# Patient Record
Sex: Male | Born: 2000 | Race: White | Hispanic: Yes | Marital: Single | Smoking: Never smoker
Health system: Southern US, Community
[De-identification: ages and names within clinical notes are randomized; demographics above are authoritative.]

## PROBLEM LIST (undated history)

## (undated) DIAGNOSIS — Z789 Other specified health status: Secondary | ICD-10-CM

---
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
## 2023-03-03 ENCOUNTER — Telehealth: Payer: Self-pay | Admitting: Family Medicine

## 2023-03-04 ENCOUNTER — Ambulatory Visit: Payer: Medicaid Other | Admitting: Family Medicine

## 2023-03-04 ENCOUNTER — Encounter: Payer: Self-pay | Admitting: Family Medicine

## 2023-03-04 VITALS — BP 129/84 | HR 89 | Ht 68.0 in | Wt 310.0 lb

## 2023-03-04 DIAGNOSIS — Z7689 Persons encountering health services in other specified circumstances: Secondary | ICD-10-CM

## 2023-03-04 DIAGNOSIS — K5909 Other constipation: Secondary | ICD-10-CM

## 2023-03-04 DIAGNOSIS — N5089 Other specified disorders of the male genital organs: Secondary | ICD-10-CM | POA: Diagnosis not present

## 2023-03-04 DIAGNOSIS — Z8669 Personal history of other diseases of the nervous system and sense organs: Secondary | ICD-10-CM

## 2023-03-04 DIAGNOSIS — K648 Other hemorrhoids: Secondary | ICD-10-CM | POA: Diagnosis not present

## 2023-03-04 DIAGNOSIS — Z5320 Procedure and treatment not carried out because of patient's decision for unspecified reasons: Secondary | ICD-10-CM

## 2023-03-04 DIAGNOSIS — L83 Acanthosis nigricans: Secondary | ICD-10-CM

## 2023-03-04 MED ORDER — POLYETHYLENE GLYCOL 3350 17 GM/SCOOP PO POWD: 17.0000 g | ORAL | 1 refills | Status: AC | PRN

## 2023-03-04 NOTE — Assessment & Plan Note (Signed)
Unknown; recommend A1c given obesity Body mass index is 47.14 kg/m.

## 2023-03-04 NOTE — Assessment & Plan Note (Signed)
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 Graduated HS Things to do to keep yourself healthy  - Exercise at least 30-45 minutes a day, 3-4 days a week.  - Eat a low-fat diet with lots of fruits and vegetables, up to 7-9 servings per day.  - Seatbelts can save your life. Wear them always.  - Smoke detectors on every level of your home, check batteries every year.  - Eye Doctor - have an eye exam every 1-2 years  - Safe sex - if you may be exposed to STDs, use a condom.  - Alcohol -  If you drink, do it moderately, less than 2 drinks per day.  - Health Care Power of Attorney. Choose someone to speak for you if you are not able.  - Depression is common in our stressful world.If you're feeling down or losing interest in things you normally enjoy, please come in for a visit.  - Violence - If anyone is threatening or hurting you, please call immediately.

## 2023-03-04 NOTE — Progress Notes (Signed)
New patient visit   Patient: Kelly Mccall   DOB: October 31, 2000   22 y.o. Male  MRN: [id number] Visit Date: 03/04/2023  Today's healthcare provider: Jacky Kindle, FNP  Patient presents for new patient visit to establish care.  Introduced to Publishing rights manager role and practice setting.  All questions answered.  Discussed provider/patient relationship and expectations.  Presents with father  Chief Complaint  Patient presents with   Rectal Bleeding    Pt stated--constipated , rectal bleeding, painful when using the bathroom. More than 1 week..   Subjective    Kelly Mccall is a 22 y.o. male who presents today as a new patient to establish care.  HPI HPI     Rectal Bleeding    Additional comments: Pt stated--constipated , rectal bleeding, painful when using the bathroom. More than 1 week..      Last edited by Shelly Bombard, CMA on 03/04/2023  1:55 PM.      History reviewed. No pertinent past medical history. History reviewed. No pertinent surgical history. Family Status  Relation Name Status   Mother  Alive   Father  Alive   Brother  Alive   Family History  Problem Relation Age of Onset   Healthy Mother    Colon cancer Father    Healthy Brother    Social History   Socioeconomic History   Marital status: Single    Spouse name: Not on file   Number of children: Not on file   Years of education: Not on file   Highest education level: Not on file  Occupational History   Not on file  Tobacco Use   Smoking status: Never   Smokeless tobacco: Never  Substance and Sexual Activity   Alcohol use: No   Drug use: Never   Sexual activity: Not on file  Other Topics Concern   Not on file  Social History Narrative   Not on file   Social Determinants of Health   Financial Resource Strain: Not on file  Food Insecurity: Not on file  Transportation Needs: Not on file  Physical Activity: Not on file  Stress: Not on file  Social Connections: Not on file    Outpatient Medications Prior to Visit  Medication Sig   Docusate Calcium (STOOL SOFTENER PO) Take by mouth.   [DISCONTINUED] ibuprofen (ADVIL,MOTRIN) 600 MG tablet Take 1 tablet (600 mg total) every 8 (eight) hours as needed by mouth. (Patient not taking: Reported on 03/04/2023)   [DISCONTINUED] ibuprofen (ADVIL,MOTRIN) 600 MG tablet Take 1 tablet (600 mg total) by mouth every 8 (eight) hours as needed. (Patient not taking: Reported on 03/04/2023)   No facility-administered medications prior to visit.   No Known Allergies   There is no immunization history on file for this patient.  Health Maintenance  Topic Date Due   COVID-19 Vaccine (1) Never done   HPV VACCINES (1 - Male 2-dose series) Never done   HIV Screening  Never done   Hepatitis C Screening  Never done   DTaP/Tdap/Td (1 - Tdap) Never done   INFLUENZA VACCINE  05/22/2023    Patient Care Team: Jacky Kindle, FNP as PCP - General (Family Medicine)  Review of Systems   Objective    BP 129/84 (BP Location: Right Arm, Patient Position: Sitting, Cuff Size: Normal)   Pulse 89   Ht 5\' 8"  (1.727 m)   Wt (!) 310 lb (140.6 kg)   SpO2 98%   BMI 47.14 kg/m  Physical Exam Vitals and nursing note reviewed.  Constitutional:      General: He is awake. He is not in acute distress.    Appearance: Normal appearance. He is well-developed and well-groomed. He is obese. He is not ill-appearing, toxic-appearing or diaphoretic.  HENT:     Head: Normocephalic and atraumatic.     Jaw: There is normal jaw occlusion. No trismus, tenderness, swelling or pain on movement.     Salivary Glands: Right salivary gland is not diffusely enlarged or tender. Left salivary gland is not diffusely enlarged or tender.      Right Ear: Hearing, tympanic membrane, ear canal and external ear normal. There is no impacted cerumen.     Left Ear: Hearing, tympanic membrane, ear canal and external ear normal. There is no impacted cerumen.     Nose: Nose  normal. No congestion or rhinorrhea.     Right Turbinates: Not enlarged, swollen or pale.     Left Turbinates: Not enlarged, swollen or pale.     Right Sinus: No maxillary sinus tenderness or frontal sinus tenderness.     Left Sinus: No maxillary sinus tenderness or frontal sinus tenderness.     Mouth/Throat:     Lips: Pink.     Mouth: Mucous membranes are moist. No injury, lacerations, oral lesions or angioedema.     Pharynx: Oropharynx is clear. Uvula midline. No pharyngeal swelling, oropharyngeal exudate or posterior oropharyngeal erythema.     Tonsils: No tonsillar exudate or tonsillar abscesses.  Eyes:     General: Lids are normal. Vision grossly intact. Gaze aligned appropriately.        Right eye: No discharge.        Left eye: No discharge.     Extraocular Movements: Extraocular movements intact.     Conjunctiva/sclera: Conjunctivae normal.     Pupils: Pupils are equal, round, and reactive to light.  Neck:     Thyroid: No thyroid mass, thyromegaly or thyroid tenderness.     Vascular: No carotid bruit.     Trachea: Trachea normal. No tracheal tenderness.  Cardiovascular:     Rate and Rhythm: Normal rate and regular rhythm.     Pulses: Normal pulses.          Carotid pulses are 2+ on the right side and 2+ on the left side.      Radial pulses are 2+ on the right side and 2+ on the left side.       Femoral pulses are 2+ on the right side and 2+ on the left side.      Popliteal pulses are 2+ on the right side and 2+ on the left side.       Dorsalis pedis pulses are 2+ on the right side and 2+ on the left side.       Posterior tibial pulses are 2+ on the right side and 2+ on the left side.     Heart sounds: Normal heart sounds, S1 normal and S2 normal. No murmur heard.    No friction rub. No gallop.  Pulmonary:     Effort: Pulmonary effort is normal. No respiratory distress.     Breath sounds: Normal breath sounds and air entry. No stridor. No wheezing, rhonchi or rales.  Chest:      Chest wall: No tenderness.  Abdominal:     General: Abdomen is flat. Bowel sounds are normal. There is no distension.     Palpations: Abdomen is soft. There is no mass.  Tenderness: There is no abdominal tenderness. There is no guarding or rebound.     Hernia: No hernia is present.  Genitourinary:    Pubic Area: No rash or pubic lice.      Penis: Normal.      Testes:        Right: Tenderness present.        Left: Mass, tenderness, swelling and varicocele present.     Epididymis:     Right: Normal.     Left: Inflamed and enlarged. Tenderness present.     Comments: Refer for [place]; denies concern for STI Musculoskeletal:        General: No swelling, tenderness, deformity or signs of injury. Normal range of motion.     Cervical back: Normal range of motion and neck supple. No rigidity or tenderness.     Right lower leg: No edema.     Left lower leg: No edema.  Lymphadenopathy:     Cervical: No cervical adenopathy.     Right cervical: No superficial, deep or posterior cervical adenopathy.    Left cervical: No superficial, deep or posterior cervical adenopathy.  Skin:    General: Skin is warm and dry.     Capillary Refill: Capillary refill takes less than 2 seconds.     Coloration: Skin is not jaundiced or pale.     Findings: Lesion and rash present. No bruising or erythema. Rash is scaling.          Comments: Healing psoriasis plaques to neck and posterior ears; L>R  Neurological:     General: No focal deficit present.     Mental Status: He is alert and oriented to person, place, and time. Mental status is at baseline.     GCS: GCS eye subscore is 4. GCS verbal subscore is 5. GCS motor subscore is 6.     Sensory: Sensation is intact. No sensory deficit.     Motor: Motor function is intact. No weakness.     Coordination: Coordination is intact.     Gait: Gait is intact.  Psychiatric:        Attention and Perception: Attention and perception normal.        Mood and Affect:  Mood and affect normal.        Speech: Speech normal.        Behavior: Behavior normal. Behavior is cooperative.        Thought Content: Thought content normal.        Cognition and Memory: Cognition normal.        Judgment: Judgment normal.    Depression Screen    03/04/2023    2:04 PM  PHQ 2/9 Scores  PHQ - 2 Score 0  PHQ- 9 Score 0   No results found for any visits on 03/04/23.  Assessment & Plan      Problem List Items Addressed This Visit       Cardiovascular and Mediastinum   Internal hemorrhoid, bleeding - Primary    Negative guaiac; continue to encourage healthy diet, normal weight, exercise and water intake Discussed use of stools and daily BM without straining to assist Deferred rectal exam given no recent concerns for bleeding and previously normal at [facility]      Relevant Orders   Comprehensive Metabolic Panel (CMET)   Ambulatory referral to Gastroenterology     Digestive   Chronic constipation    Notes sitting on the toilet for extended periods of time; unknown cause Father with stage  4 colon cancer Referral to gi; continue to monitor s/s- goal 1 daily BM Working on fiber, whole grains, exercise, water control       Relevant Medications   Docusate Calcium (STOOL SOFTENER PO)   polyethylene glycol powder (GLYCOLAX/MIRALAX) 17 GM/SCOOP powder   Other Relevant Orders   CBC with Differential/Platelet   Comprehensive Metabolic Panel (CMET)   Hemoglobin A1c   Lipid panel   Ambulatory referral to Gastroenterology     Musculoskeletal and Integument   Acanthosis nigricans    Unknown; recommend A1c given obesity Body mass index is 47.14 kg/m.       Relevant Orders   Hemoglobin A1c     Other   Encounter to establish care    Self pay- not employed; not a Consulting civil engineer. Graduated HS Things to do to keep yourself healthy  - Exercise at least 30-45 minutes a day, 3-4 days a week.  - Eat a low-fat diet with lots of fruits and vegetables, up to 7-9 servings per  day.  - Seatbelts can save your life. Wear them always.  - Smoke detectors on every level of your home, check batteries every year.  - Eye Doctor - have an eye exam every 1-2 years  - Safe sex - if you may be exposed to STDs, use a condom.  - Alcohol -  If you drink, do it moderately, less than 2 drinks per day.  - Health Care Power of Attorney. Choose someone to speak for you if you are not able.  - Depression is common in our stressful world.If you're feeling down or losing interest in things you normally enjoy, please come in for a visit.  - Violence - If anyone is threatening or hurting you, please call immediately.       History of Bell's palsy    Slight deficit noted in full smile on L side; defer Mg at this time d/t cost       HIV screening declined   Morbid obesity (HCC)    Chronic, unknown change Body mass index is 47.14 kg/m. Discussed importance of healthy weight management Discussed diet and exercise       Relevant Orders   CBC with Differential/Platelet   Comprehensive Metabolic Panel (CMET)   Hemoglobin A1c   Lipid panel   Screening for hepatitis C declined   Testicular mass    L with tenderness; referral placed for [place] Patient denies STI risk Notes slight weight change in setting of chronic constipation and use of stool softeners; unable to quantify       Relevant Orders   US Scrotum   Return if symptoms worsen or fail to improve.    Leilani Merl, FNP, have reviewed all documentation for this visit. The documentation on 03/04/23 for the exam, diagnosis, procedures, and orders are all accurate and complete.  Jacky Kindle, FNP  [facility] [phone] (phone) [phone] (fax)  [facility]

## 2023-03-04 NOTE — Assessment & Plan Note (Signed)
Negative guaiac; continue to encourage healthy diet, normal weight, exercise and water intake Discussed use of stools and daily BM without straining to assist Deferred rectal exam given no recent concerns for bleeding and previously normal at [facility]

## 2023-03-04 NOTE — Assessment & Plan Note (Signed)
Chronic, unknown change Body mass index is 47.14 kg/m. Discussed importance of healthy weight management Discussed diet and exercise

## 2023-03-04 NOTE — Assessment & Plan Note (Signed)
Slight deficit noted in full smile on L side; defer Mg at this time d/t cost

## 2023-03-04 NOTE — Assessment & Plan Note (Signed)
Notes sitting on the toilet for extended periods of time; unknown cause Father with stage 4 colon cancer Referral to gi; continue to monitor s/s- goal 1 daily BM Working on fiber, whole grains, exercise, water control

## 2023-03-04 NOTE — Assessment & Plan Note (Signed)
L with tenderness; referral placed for [place] Patient denies STI risk Notes slight weight change in setting of chronic constipation and use of stool softeners; unable to quantify

## 2023-03-05 ENCOUNTER — Other Ambulatory Visit: Payer: Self-pay | Admitting: Family Medicine

## 2023-03-05 ENCOUNTER — Other Ambulatory Visit: Payer: Self-pay

## 2023-03-05 ENCOUNTER — Ambulatory Visit
Admission: RE | Admit: 2023-03-05 | Discharge: 2023-03-05 | Disposition: A | Discharge status: Home / Self Care | Payer: Medicaid Other | Source: Ambulatory Visit | Attending: Family Medicine | Admitting: Family Medicine

## 2023-03-05 DIAGNOSIS — K5909 Other constipation: Secondary | ICD-10-CM

## 2023-03-05 DIAGNOSIS — N5089 Other specified disorders of the male genital organs: Secondary | ICD-10-CM

## 2023-03-05 DIAGNOSIS — Z5320 Procedure and treatment not carried out because of patient's decision for unspecified reasons: Secondary | ICD-10-CM

## 2023-03-05 DIAGNOSIS — Z8669 Personal history of other diseases of the nervous system and sense organs: Secondary | ICD-10-CM

## 2023-03-05 DIAGNOSIS — Z7689 Persons encountering health services in other specified circumstances: Secondary | ICD-10-CM

## 2023-03-05 DIAGNOSIS — L83 Acanthosis nigricans: Secondary | ICD-10-CM

## 2023-03-05 DIAGNOSIS — K648 Other hemorrhoids: Secondary | ICD-10-CM

## 2023-03-05 LAB — HEMOGLOBIN A1C
Est. average glucose Bld gHb Est-mCnc: 120 mg/dL
Hgb A1c MFr Bld: 5.8 % — ABNORMAL HIGH (ref 4.8–5.6)

## 2023-03-05 LAB — LIPID PANEL
Chol/HDL Ratio: 5.5 ratio — ABNORMAL HIGH (ref 0.0–5.0)
Cholesterol, Total: 170 mg/dL (ref 100–199)
HDL: 31 mg/dL — ABNORMAL LOW (ref >39)
LDL Chol Calc (NIH): 113 mg/dL — ABNORMAL HIGH (ref 0–99)
Triglycerides: 144 mg/dL (ref 0–149)
VLDL Cholesterol Cal: 26 mg/dL (ref 5–40)

## 2023-03-05 LAB — CBC WITH DIFFERENTIAL/PLATELET
Basophils Absolute: 0 10*3/uL (ref 0.0–0.2)
Basos: 0 %
EOS (ABSOLUTE): 0.3 10*3/uL (ref 0.0–0.4)
Eos: 3 %
Hematocrit: 43.9 % (ref 37.5–51.0)
Hemoglobin: 14.2 g/dL (ref 13.0–17.7)
Immature Grans (Abs): 0 10*3/uL (ref 0.0–0.1)
Immature Granulocytes: 0 %
Lymphocytes Absolute: 4.9 10*3/uL — ABNORMAL HIGH (ref 0.7–3.1)
Lymphs: 45 %
MCH: 26.7 pg (ref 26.6–33.0)
MCHC: 32.3 g/dL (ref 31.5–35.7)
MCV: 83 fL (ref 79–97)
Monocytes Absolute: 0.6 10*3/uL (ref 0.1–0.9)
Monocytes: 6 %
Neutrophils Absolute: 5 10*3/uL (ref 1.4–7.0)
Neutrophils: 46 %
Platelets: 411 10*3/uL (ref 150–450)
RBC: 5.31 x10E6/uL (ref 4.14–5.80)
RDW: 13.7 % (ref 11.6–15.4)
WBC: 10.9 10*3/uL — ABNORMAL HIGH (ref 3.4–10.8)

## 2023-03-05 LAB — COMPREHENSIVE METABOLIC PANEL
ALT: 137 IU/L — ABNORMAL HIGH (ref 0–44)
AST: 41 IU/L — ABNORMAL HIGH (ref 0–40)
Albumin/Globulin Ratio: 1.6 (ref 1.2–2.2)
Albumin: 4.4 g/dL (ref 4.3–5.2)
Alkaline Phosphatase: 83 IU/L (ref 51–125)
BUN/Creatinine Ratio: 11 (ref 9–20)
BUN: 9 mg/dL (ref 6–20)
Bilirubin Total: 0.4 mg/dL (ref 0.0–1.2)
CO2: 26 mmol/L (ref 20–29)
Calcium: 9.5 mg/dL (ref 8.7–10.2)
Chloride: 102 mmol/L (ref 96–106)
Creatinine, Ser: 0.81 mg/dL (ref 0.76–1.27)
Globulin, Total: 2.8 g/dL (ref 1.5–4.5)
Glucose: 93 mg/dL (ref 70–99)
Potassium: 4.5 mmol/L (ref 3.5–5.2)
Sodium: 140 mmol/L (ref 134–144)
Total Protein: 7.2 g/dL (ref 6.0–8.5)
eGFR: 129 mL/min/{1.73_m2} (ref >59)

## 2023-03-05 NOTE — Progress Notes (Signed)
Bilateral epididymal head cysts measuring up to 2.0 cm on the left and 0.8 cm on the right.  Continue to monitor; benign in nature. If further inflammation remains we can get you in with urology for cyst draining.

## 2023-03-05 NOTE — Progress Notes (Signed)
-   Borderline infectious/inflammatory markers- WBC/lymphocytes. No anemia noted. - Blood chemistry shows elevated liver enzymes; which can come from fat in diet, recent alcohol use or excessive use of NSAIDs. Recommend avoid NSAIDs in setting of bleeding concerns. OK to use tylenol to assist if any minor pains.  - A1c shows pre-diabetes. Continue to recommend balanced, lower carb meals. Smaller meal size, adding snacks. Choosing water as drink of choice and increasing purposeful exercise. - Cholesterol shows high bad/LDL cholesterol and low good/HDL cholesterol. I continue to recommend diet low in saturated fat and regular exercise - 30 min at least 5 times per week

## 2023-03-06 ENCOUNTER — Ambulatory Visit (INDEPENDENT_AMBULATORY_CARE_PROVIDER_SITE_OTHER): Payer: Medicaid Other | Admitting: Gastroenterology

## 2023-03-06 ENCOUNTER — Other Ambulatory Visit: Payer: Self-pay

## 2023-03-06 ENCOUNTER — Encounter: Payer: Self-pay | Admitting: Gastroenterology

## 2023-03-06 VITALS — BP 123/89 | HR 76 | Temp 97.8°F | Ht 68.0 in | Wt 310.2 lb

## 2023-03-06 DIAGNOSIS — E8881 Metabolic syndrome: Secondary | ICD-10-CM | POA: Diagnosis not present

## 2023-03-06 DIAGNOSIS — K625 Hemorrhage of anus and rectum: Secondary | ICD-10-CM

## 2023-03-06 DIAGNOSIS — Z800 Family history of malignant neoplasm of digestive organs: Secondary | ICD-10-CM

## 2023-03-06 DIAGNOSIS — R7989 Other specified abnormal findings of blood chemistry: Secondary | ICD-10-CM

## 2023-03-06 DIAGNOSIS — K5909 Other constipation: Secondary | ICD-10-CM | POA: Diagnosis not present

## 2023-03-06 NOTE — Progress Notes (Signed)
Kelly Repress, MD [address]  [address]  [place], [place] [address]  Main: [phone]  Fax: [phone]    Gastroenterology Consultation  Referring Provider:     Jacky Kindle, FNP Primary Care Physician:  Kelly Kindle, FNP Primary Gastroenterologist:  Dr. Arlyss Mccall Reason for Consultation: Rectal bleeding, chronic constipation        HPI:   Kelly Mccall is a 22 y.o. male referred by Kelly Kindle, FNP  for consultation & management of rectal bleeding and chronic constipation.  Patient with history of morbid obesity, has history of chronic constipation as well as intermittent bright red blood per rectum.  Patient reports that he spends about 20 to 30 minutes on the toilet to have bowel movement associated with straining and he spends on his phone.  He has been noticing streaks of blood on surface of the stool.  For last 3 to 4 days, he has been taking stool softener, eating more fruits and vegetables, reports having bowel movement daily and does not have to strain a lot.  And he has stopped noticing blood in the stool.  Patient is accompanied by his father today.  Patient denies any other GI symptoms.  He does acknowledge unhealthy eating habits including sugary drinks, snacks, junk foods.  His father has stage IV colon cancer, recently diagnosed and he is 30 years old.  His father's uncle had colon cancer diagnosed in his 18s as well. Labs revealed prediabetes, normal hemoglobin, elevated AST and ALT The patient reports that he graduated from high school about 2 years ago and has been trying to find a job.  Currently, staying at home, leads a sedentary lifestyle   NSAIDs: None  Antiplts/Anticoagulants/Anti thrombotics: None  GI Procedures: None  History reviewed. No pertinent past medical history.  History reviewed. No pertinent surgical history.   Current Outpatient Medications:    bisacodyl (DULCOLAX) 5 MG EC tablet, Take 5 mg by mouth daily as  needed., Disp: , Rfl:    Docusate Calcium (STOOL SOFTENER PO), Take by mouth., Disp: , Rfl:    polyethylene glycol powder (GLYCOLAX/MIRALAX) 17 GM/SCOOP powder, Take 17 g by mouth as needed for mild constipation. Titrate to one soft BM per day, Disp: 3350 g, Rfl: 1   Family History  Problem Relation Age of Onset   Healthy Mother    Colon cancer Father    Healthy Brother      Social History   Tobacco Use   Smoking status: Never   Smokeless tobacco: Never  Substance Use Topics   Alcohol use: No   Drug use: Never    Allergies as of 03/06/2023   (No Known Allergies)    Review of Systems:    All systems reviewed and negative except where noted in HPI.   Physical Exam:  BP 123/89 (BP Location: Right Arm, Patient Position: Sitting, Cuff Size: Large)   Pulse 76   Temp 97.8 F (36.6 C) (Oral)   Ht 5\' 8"  (1.727 m)   Wt (!) 310 lb 4 oz (140.7 kg)   BMI 47.17 kg/m  No LMP for male patient.  General:   Alert,  Well-developed, well-nourished, pleasant and cooperative in NAD Head:  Normocephalic and atraumatic. Eyes:  Sclera clear, no icterus.   Conjunctiva pink. Ears:  Normal auditory acuity. Nose:  No deformity, discharge, or lesions. Mouth:  No deformity or lesions,oropharynx pink & moist. Neck:  Supple; no masses or thyromegaly. Lungs:  Respirations even  and unlabored.  Clear throughout to auscultation.   No wheezes, crackles, or rhonchi. No acute distress. Heart:  Regular rate and rhythm; no murmurs, clicks, rubs, or gallops. Abdomen:  Normal bowel sounds. Soft, non-tender and non-distended without masses, hepatosplenomegaly or hernias noted.  No guarding or rebound tenderness.   Rectal: Not performed Msk:  Symmetrical without gross deformities. Good, equal movement & strength bilaterally. Pulses:  Normal pulses noted. Extremities:  No clubbing or edema.  No cyanosis. Neurologic:  Alert and oriented x3;  grossly normal neurologically. Skin:  Intact without significant  lesions or rashes. No jaundice. Psych:  Alert and cooperative. Normal mood and affect.  Imaging Studies: No abdominal imaging  Assessment and Plan:   Kelly Mccall is a 22 y.o. male with metabolic syndrome, chronic constipation, bright red blood per rectum Father with stage IV colon cancer in his 23s Recommend flexible sigmoidoscopy Discussed in length regarding management of chronic constipation, proper toilet hygiene and adequate intake of water High-fiber diet information provided Continue MiraLAX daily  Elevated LFTs  ALT > AST Likely secondary to fatty liver in setting of metabolic syndrome Discussed about healthy lifestyle and physical activity/exercise 20 to 30 minutes daily, 5 days a week Recheck LFTs in 6 months, if persistently elevated, recommend secondary liver disease workup   Follow up in 6 months with the PA-C, Kelly Monarch, MD

## 2023-03-13 ENCOUNTER — Telehealth: Payer: Self-pay

## 2023-03-13 NOTE — Telephone Encounter (Signed)
Patient drank orange juice this morning so we need to reschedule flexsigmoid. Reschedule to 03/18/2023 with Dr. Allegra Lai in [place]

## 2023-03-18 ENCOUNTER — Ambulatory Visit
Admission: RE | Admit: 2023-03-18 | Discharge: 2023-03-18 | Disposition: A | Discharge status: Home / Self Care | Payer: Medicaid Other | Attending: Gastroenterology | Admitting: Gastroenterology

## 2023-03-18 ENCOUNTER — Encounter: Payer: Self-pay | Admitting: Gastroenterology

## 2023-03-18 ENCOUNTER — Other Ambulatory Visit: Payer: Self-pay

## 2023-03-18 ENCOUNTER — Ambulatory Visit: Payer: Medicaid Other | Admitting: Anesthesiology

## 2023-03-18 ENCOUNTER — Encounter
Admission: RE | Disposition: A | Discharge status: Home / Self Care | Payer: Self-pay | Source: Home / Self Care | Attending: Gastroenterology

## 2023-03-18 DIAGNOSIS — K625 Hemorrhage of anus and rectum: Secondary | ICD-10-CM | POA: Insufficient documentation

## 2023-03-18 DIAGNOSIS — K635 Polyp of colon: Secondary | ICD-10-CM | POA: Diagnosis not present

## 2023-03-18 DIAGNOSIS — D125 Benign neoplasm of sigmoid colon: Secondary | ICD-10-CM | POA: Insufficient documentation

## 2023-03-18 DIAGNOSIS — Z6841 Body Mass Index (BMI) 40.0 and over, adult: Secondary | ICD-10-CM | POA: Diagnosis not present

## 2023-03-18 HISTORY — DX: Morbid (severe) obesity due to excess calories: E66.01

## 2023-03-18 HISTORY — DX: Other specified health status: Z78.9

## 2023-03-18 HISTORY — PX: POLYPECTOMY: SHX5525

## 2023-03-18 HISTORY — PX: FLEXIBLE SIGMOIDOSCOPY: SHX5431

## 2023-03-18 SURGERY — SIGMOIDOSCOPY, FLEXIBLE
Anesthesia: General | Site: Rectum

## 2023-03-18 MED ORDER — LACTATED RINGERS IV SOLN: INTRAVENOUS | Status: DC

## 2023-03-18 MED ORDER — PROPOFOL 10 MG/ML IV BOLUS
INTRAVENOUS | Status: DC | PRN
  Administered 2023-03-18: 60 mg via INTRAVENOUS
  Administered 2023-03-18: 20 mg via INTRAVENOUS

## 2023-03-18 MED ORDER — PROPOFOL 500 MG/50ML IV EMUL
INTRAVENOUS | Status: DC | PRN
  Administered 2023-03-18: 150 ug/kg/min via INTRAVENOUS

## 2023-03-18 MED ORDER — SODIUM CHLORIDE 0.9 % IV SOLN: INTRAVENOUS | Status: DC

## 2023-03-18 MED ORDER — STERILE WATER FOR IRRIGATION IR SOLN
Status: DC | PRN
  Administered 2023-03-18: 50 mL

## 2023-03-18 MED ORDER — LIDOCAINE HCL (CARDIAC) PF 100 MG/5ML IV SOSY
PREFILLED_SYRINGE | INTRAVENOUS | Status: DC | PRN
  Administered 2023-03-18: 60 mg via INTRAVENOUS

## 2023-03-18 SURGICAL SUPPLY — 11 items
CLIP HMST 235XBRD CATH ROT (MISCELLANEOUS) IMPLANT
CLIP RESOLUTION 360 11X235 (MISCELLANEOUS) ×2
GOWN CVR UNV OPN BCK APRN NK (MISCELLANEOUS) ×4 IMPLANT
GOWN ISOL THUMB LOOP REG UNIV (MISCELLANEOUS) ×4
KIT PRC NS LF DISP ENDO (KITS) ×2 IMPLANT
KIT PROCEDURE OLYMPUS (KITS) ×2
MANIFOLD NEPTUNE II (INSTRUMENTS) ×2 IMPLANT
RETRIEVER NET ROTH 2.5X230 LF (MISCELLANEOUS) IMPLANT
SNARE LASSO HEX 3 IN 1 (INSTRUMENTS) IMPLANT
TRAP ETRAP POLY (MISCELLANEOUS) IMPLANT
WATER STERILE IRR 250ML POUR (IV SOLUTION) ×2 IMPLANT

## 2023-03-18 NOTE — Transfer of Care (Signed)
Immediate Anesthesia Transfer of Care Note  Patient: Dawuan Bendolph  Procedure(s) Performed: FLEXIBLE SIGMOIDOSCOPY WITH BIOPSY (Rectum) POLYPECTOMY (Rectum)  Patient Location: PACU  Anesthesia Type:General  Level of Consciousness: awake  Airway & Oxygen Therapy: Patient Spontanous Breathing  Post-op Assessment: Report given to RN and Post -op Vital signs reviewed and stable  Post vital signs: Reviewed and stable  Last Vitals:  Vitals Value Taken Time  BP 104/49 03/18/23 1006  Temp 36.4 C 03/18/23 1006  Pulse 87 03/18/23 1007  Resp 23 03/18/23 1007  SpO2 94 % 03/18/23 1007  Vitals shown include unvalidated device data.  Last Pain:  Vitals:   03/18/23 1006  TempSrc:   PainSc: Asleep         Complications: No notable events documented.

## 2023-03-18 NOTE — Anesthesia Postprocedure Evaluation (Signed)
Anesthesia Post Note  Patient: Juanya Strahle  Procedure(s) Performed: FLEXIBLE SIGMOIDOSCOPY WITH BIOPSY (Rectum) POLYPECTOMY (Rectum)  Patient location during evaluation: PACU Anesthesia Type: General Level of consciousness: awake and alert Pain management: pain level controlled Vital Signs Assessment: post-procedure vital signs reviewed and stable Respiratory status: spontaneous breathing, nonlabored ventilation, respiratory function stable and patient connected to nasal cannula oxygen Cardiovascular status: blood pressure returned to baseline and stable Postop Assessment: no apparent nausea or vomiting Anesthetic complications: no   No notable events documented.   Last Vitals:  Vitals:   03/18/23 1006 03/18/23 1015  BP: (!) 104/49 (!) 112/57  Pulse: 82 90  Resp: 14 (!) 27  Temp: (!) 36.4 C (!) 36.4 C  SpO2: 92% 96%    Last Pain:  Vitals:   03/18/23 1015  TempSrc:   PainSc: 0-No pain                 Oda Lansdowne C Ingvald Theisen

## 2023-03-18 NOTE — Anesthesia Preprocedure Evaluation (Addendum)
Anesthesia Evaluation  Patient identified by MRN, date of birth, ID band Patient awake    Reviewed: Allergy & Precautions, H&P , NPO status , Patient's Chart, lab work & pertinent test results  Airway Mallampati: IV  TM Distance: >3 FB Neck ROM: Full    Dental no notable dental hx.    Pulmonary neg pulmonary ROS   Pulmonary exam normal breath sounds clear to auscultation       Cardiovascular negative cardio ROS Normal cardiovascular exam Rhythm:Regular Rate:Normal     Neuro/Psych negative neurological ROS  negative psych ROS   GI/Hepatic negative GI ROS, Neg liver ROS,,,  Endo/Other  negative endocrine ROS    Renal/GU negative Renal ROS  negative genitourinary   Musculoskeletal negative musculoskeletal ROS (+)    Abdominal   Peds negative pediatric ROS (+)  Hematology negative hematology ROS (+)   Anesthesia Other Findings Patient is morbidly obese at 46.83 BMI Patient reports symptoms consistent with sleep apnea. I discussed w/patient and father (?) risks of untreated sleep apnea, including, but not limited to, stroke, heart attack, atrial fibrillation, death, daytime drowsiness and urged him to have a sleep study. He states that he will comply.   Reproductive/Obstetrics negative OB ROS                             Anesthesia Physical Anesthesia Plan  ASA: 2  Anesthesia Plan: General   Post-op Pain Management:    Induction: Intravenous  PONV Risk Score and Plan:   Airway Management Planned: Natural Airway and Nasal Cannula  Additional Equipment:   Intra-op Plan:   Post-operative Plan:   Informed Consent: I have reviewed the patients History and Physical, chart, labs and discussed the procedure including the risks, benefits and alternatives for the proposed anesthesia with the patient or authorized representative who has indicated his/her understanding and acceptance.      Dental Advisory Given  Plan Discussed with: Anesthesiologist, CRNA and Surgeon  Anesthesia Plan Comments: (Patient consented for risks of anesthesia including but not limited to:  - adverse reactions to medications - risk of airway placement if required - damage to eyes, teeth, lips or other oral mucosa - nerve damage due to positioning  - sore throat or hoarseness - Damage to heart, brain, nerves, lungs, other parts of body or loss of life  Patient voiced understanding.)       Anesthesia Quick Evaluation

## 2023-03-18 NOTE — Op Note (Signed)
[facility] Gastroenterology Patient Name: Kelly Mccall Procedure Date: 03/18/2023 9:29 AM MRN: [id number] Account #: [id number] Date of Birth: 2001/09/20 Admit Type: Outpatient Age: 22 Room: [facility] OR ROOM 01 Gender: Male Note Status: Finalized Instrument Name: [id number] Procedure:             Flexible Sigmoidoscopy Indications:           Rectal bleeding Providers:             Toney Reil MD, MD Referring MD:          Daryl Eastern. Suzie Portela (Referring MD) Medicines:             General Anesthesia Complications:         No immediate complications. Estimated blood loss: None. Procedure:             Pre-Anesthesia Assessment:                        - Prior to the procedure, a History and Physical was                         performed, and patient medications and allergies were                         reviewed. The patient is competent. The risks and                         benefits of the procedure and the sedation options and                         risks were discussed with the patient. All questions                         were answered and informed consent was obtained.                         Patient identification and proposed procedure were                         verified by the physician, the nurse, the                         anesthesiologist, the anesthetist and the technician                         in the pre-procedure area in the procedure room in the                         endoscopy suite. Mental Status Examination: alert and                         oriented. Airway Examination: normal oropharyngeal                         airway and neck mobility. Respiratory Examination:                         clear to auscultation. CV Examination: normal.  Prophylactic Antibiotics: The patient does not require                         prophylactic antibiotics. Prior Anticoagulants: The                         patient has taken no  anticoagulant or antiplatelet                         agents. ASA Grade Assessment: II - A patient with mild                         systemic disease. After reviewing the risks and                         benefits, the patient was deemed in satisfactory                         condition to undergo the procedure. The anesthesia                         plan was to use general anesthesia. Immediately prior                         to administration of medications, the patient was                         re-assessed for adequacy to receive sedatives. The                         heart rate, respiratory rate, oxygen saturations,                         blood pressure, adequacy of pulmonary ventilation, and                         response to care were monitored throughout the                         procedure. The physical status of the patient was                         re-assessed after the procedure.                        After obtaining informed consent, the scope was passed                         under direct vision. The Endoscope was introduced                         through the anus and advanced to the the left                         transverse colon. The flexible sigmoidoscopy was                         accomplished without difficulty. The patient tolerated  the procedure well. The quality of the bowel                         preparation was adequate. Findings:      The perianal and digital rectal examinations were normal. Pertinent       negatives include normal sphincter tone and no palpable rectal lesions.      A 20 mm, non-bleeding polyp was found in the sigmoid colon. The polyp       was pedunculated. The polyp was removed with a hot snare. Resection and       retrieval were complete. To prevent bleeding after the polypectomy, two       hemostatic clips were successfully placed (MR safe). Clip manufacturer:       [employer]. There was no bleeding  during, or at the end, of the       procedure. Estimated blood loss: none.      Normal retroflexion      The exam was otherwise without abnormality. Impression:            - One 20 mm, non-bleeding polyp in the sigmoid colon,                         removed with a hot snare. Resected and retrieved. Clip                         manufacturer: [employer]. Clips (MR safe) were                         placed.                        - The examination was otherwise normal. Recommendation:        - Discharge patient to home (with escort).                        - High fiber diet.                        - Await pathology results.                        - Perform a colonoscopy at appointment to be scheduled                         based on the pathology results. Procedure Code(s):     --- Professional ---                        [phone], Sigmoidoscopy, flexible; with removal of                         tumor(s), polyp(s), or other lesion(s) by snare                         technique Diagnosis Code(s):     --- Professional ---                        D12.5, Benign neoplasm of sigmoid colon  K62.5, Hemorrhage of anus and rectum CPT copyright 2022 American Medical Association. All rights reserved. The codes documented in this report are preliminary and upon coder review may  be revised to meet current compliance requirements. Dr. Libby Maw Toney Reil MD, MD 03/18/2023 10:07:06 AM This report has been signed electronically. Number of Addenda: 0 Note Initiated On: 03/18/2023 9:29 AM Total Procedure Duration: 0 hours 15 minutes 1 second  Estimated Blood Loss:  Estimated blood loss: none.      [facility]

## 2023-03-18 NOTE — H&P (Signed)
  Arlyss Repress, MD [address]  [address]  [place], [place] [address]  Main: [phone]  Fax: [phone] Pager: [phone]  Primary Care Physician:  Jacky Kindle, FNP Primary Gastroenterologist:  Dr. Arlyss Repress  Pre-Procedure History & Physical: HPI:  Kelly Mccall is a 22 y.o. male is here for an flexible sigmoidoscopy.   Past Medical History:  Diagnosis Date   Medical history non-contributory    Morbid obesity with body mass index (BMI) of 45.0 to 49.9 in adult [facility])     History reviewed. No pertinent surgical history.  Prior to Admission medications   Medication Sig Start Date End Date Taking? Authorizing Provider  bisacodyl (DULCOLAX) 5 MG EC tablet Take 5 mg by mouth daily as needed. 02/27/23  Yes [provider]  Docusate Calcium (STOOL SOFTENER PO) Take by mouth.   Yes [provider]  polyethylene glycol powder (GLYCOLAX/MIRALAX) 17 GM/SCOOP powder Take 17 g by mouth as needed for mild constipation. Titrate to one soft BM per day 03/04/23  Yes Jacky Kindle, FNP    Allergies as of 03/06/2023   (No Known Allergies)    Family History  Problem Relation Age of Onset   Healthy Mother    Colon cancer Father    Healthy Brother     Social History   Socioeconomic History   Marital status: Single    Spouse name: Not on file   Number of children: Not on file   Years of education: Not on file   Highest education level: Not on file  Occupational History   Not on file  Tobacco Use   Smoking status: Never   Smokeless tobacco: Never  Vaping Use   Vaping Use: Never used  Substance and Sexual Activity   Alcohol use: No   Drug use: Never   Sexual activity: Not on file  Other Topics Concern   Not on file  Social History Narrative   Not on file   Social Determinants of Health   Financial Resource Strain: Not on file  Food Insecurity: Not on file  Transportation Needs: Not on file  Physical Activity: Not on file  Stress:  Not on file  Social Connections: Not on file  Intimate Partner Violence: Not on file    Review of Systems: See HPI, otherwise negative ROS  Physical Exam: BP 110/66   Pulse 96   Temp (!) 97.5 F (36.4 C) (Temporal)   Resp 18   Ht 5\' 8"  (1.727 m)   Wt (!) 139.7 kg   SpO2 95%   BMI 46.83 kg/m  General:   Alert,  pleasant and cooperative in NAD Head:  Normocephalic and atraumatic. Neck:  Supple; no masses or thyromegaly. Lungs:  Clear throughout to auscultation.    Heart:  Regular rate and rhythm. Abdomen:  Soft, nontender and nondistended. Normal bowel sounds, without guarding, and without rebound.   Neurologic:  Alert and  oriented x4;  grossly normal neurologically.  Impression/Plan: Kelly Mccall is here for an flexible sigmoidoscopy to be performed for rectal bleeding  Risks, benefits, limitations, and alternatives regarding  flexible sigmoidoscopy have been reviewed with the patient.  Questions have been answered.  All parties agreeable.   Lannette Donath, MD  03/18/2023, 9:37 AM

## 2023-03-19 ENCOUNTER — Encounter: Payer: Self-pay | Admitting: Gastroenterology

## 2023-03-26 ENCOUNTER — Other Ambulatory Visit: Payer: Self-pay

## 2023-03-27 ENCOUNTER — Telehealth: Payer: Self-pay

## 2023-03-27 NOTE — Telephone Encounter (Signed)
-----   Message from Kelly Reil, MD sent at 03/27/2023 11:19 AM EDT ----- Please inform patient that the polyp came back as inflammatory polyp.  It is benign and does not increase his risk for colon cancer.  Therefore, he does not need a colonoscopy.  Polyp was the cause of his rectal bleeding  RV

## 2023-03-27 NOTE — Telephone Encounter (Signed)
Patient verbalized understanding of results  

## 2023-04-22 NOTE — Progress Notes (Deleted)
Celso Amy, PA-C [address]  [address]  [place], [place] [address]  Main: [phone]  Fax: [phone]   Primary Care Physician: Jacky Kindle, FNP  Primary Gastroenterologist:  Celso Amy, PA-C / Dr. Marlaine Hind   CC: Follow-up for rectal bleeding and constipation  HPI: Kelly Mccall is a 22 y.o. male returns for follow-up of rectal bleeding and chronic constipation.  He saw Dr. Allegra Lai 03/11/2023 for evaluation.  Flexible sigmoidoscopy 03/18/2023 showed a 20 mm pedunculated nonbleeding inflammatory polyp removed from the sigmoid colon.  2 clips were placed.  Adequate prep.  It was recommended for patient to have a follow-up colonoscopy.  His father has stage IV colon cancer.  Diagnosed age 77.  History of morbid obesity, fatty liver, and chronic constipation.  Tried a stool softener Dulcolax and Colace.  Also tried MiraLAX.  Labs 03/04/2023 showed prediabetes (A1c 5.8), normal hemoglobin (14.2), elevated AST (41) and ALT (137).  He has not had any recent abdominal or liver imaging.  Current Outpatient Medications  Medication Sig Dispense Refill   bisacodyl (DULCOLAX) 5 MG EC tablet Take 5 mg by mouth daily as needed.     Docusate Calcium (STOOL SOFTENER PO) Take by mouth.     polyethylene glycol powder (GLYCOLAX/MIRALAX) 17 GM/SCOOP powder Take 17 g by mouth as needed for mild constipation. Titrate to one soft BM per day 3350 g 1   No current facility-administered medications for this visit.    Allergies as of 04/25/2023   (No Known Allergies)    Past Medical History:  Diagnosis Date   Medical history non-contributory    Morbid obesity with body mass index (BMI) of 45.0 to 49.9 in adult [facility])     Past Surgical History:  Procedure Laterality Date   FLEXIBLE SIGMOIDOSCOPY N/A 03/18/2023   Procedure: FLEXIBLE SIGMOIDOSCOPY WITH BIOPSY;  Surgeon: Toney Reil, MD;  Location: [facility];  Service: Endoscopy;  Laterality: N/A;    POLYPECTOMY  03/18/2023   Procedure: POLYPECTOMY;  Surgeon: Toney Reil, MD;  Location: [facility];  Service: Endoscopy;;  CLIP X 2 PLACED AT SIGMOID COLON POLYP REMOVAL SITE    Review of Systems:    All systems reviewed and negative except where noted in HPI.   Physical Examination:   There were no vitals taken for this visit.  General: Well-nourished, well-developed in no acute distress.  Eyes: No icterus. Conjunctivae pink. Mouth: Oropharyngeal mucosa moist and pink , no lesions erythema or exudate. Lungs: Clear to auscultation bilaterally. Non-labored. Heart: Regular rate and rhythm, no murmurs rubs or gallops.  Abdomen: Bowel sounds are normal; Abdomen is Soft; No hepatosplenomegaly, masses or hernias;  No Abdominal Tenderness; No guarding or rebound tenderness. Extremities: No lower extremity edema. No clubbing or deformities. Neuro: Alert and oriented x 3.  Grossly intact. Skin: Warm and dry, no jaundice.   Psych: Alert and cooperative, normal mood and affect.   Imaging Studies: No results found.  Assessment and Plan:   Kelly Mccall is a 22 y.o. y/o male returns for follow-up of:  1.  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  Schedule colonoscopy with Dr. Allegra Lai  2.  Family history of colon cancer -father age 60 (stage IV)  3.  Fatty liver disease with elevated liver transaminases  Scheduling RUQ abdominal ultrasound  Lab: Hepatic panel, viral hepatitis A/B/C.  Recommend a low-fat diet, regular exercise, and weight loss. Patient education handout about fatty liver disease was given and  discussed from up-to-date.  Repeat LFTs every 6 months.  4.  Chronic constipation  Discussed constipation treatment at length. Recommend High Fiber diet with fruits, vegetables, and whole grains. Drink 64 ounces of Fluids Daily. Start Miralax Mix 1 capful in a drink daily. Start Benefiber Mix 1 TBSP in a drink daily. Start Colace (docusate sodium)  100mg  1 capsule once daily. If no improvement, consider Linzess, Amitiza or Trulance.    Celso Amy, PA-C  Follow up in 3-6 months.

## 2023-04-25 ENCOUNTER — Encounter: Payer: Self-pay | Admitting: Physician Assistant

## 2023-04-25 ENCOUNTER — Ambulatory Visit: Payer: Medicaid Other | Admitting: Physician Assistant

## 2024-05-03 ENCOUNTER — Ambulatory Visit
Admission: EM | Admit: 2024-05-03 | Discharge: 2024-05-03 | Disposition: A | Discharge status: Home / Self Care | Attending: Emergency Medicine | Admitting: Emergency Medicine

## 2024-05-03 ENCOUNTER — Encounter: Payer: Self-pay | Admitting: Emergency Medicine

## 2024-05-03 DIAGNOSIS — L709 Acne, unspecified: Secondary | ICD-10-CM

## 2024-05-03 MED ORDER — CLINDAMYCIN PHOS-BENZOYL PEROX 1.2-5 % EX GEL: 1.0000 | Freq: Every day | CUTANEOUS | 0 refills | Status: DC

## 2024-05-03 NOTE — ED Provider Notes (Signed)
 HPI  SUBJECTIVE:  Kelly Mccall is a 23 y.o. male who presents with multiple painful masses on his neck, scalp, neck, back of his neck for the past month and a half.  They largely not changed in size since they started.  States a few have drained white/yellow pus followed by blood spontaneously with improvement in pain and decreased size.  He denies actively trying to drain these lesions.  He reports using a new cologne prior to these masses showing up, but has not been putting cologne in these areas.  No other new lotions, soaps, detergents.  No fevers.  He has also had intermittent painful masses in his inner thighs and bilateral axilla for the past 6 months that resolve on their own.  He has a few nonerythematous masses on his left upper thigh currently.  He has been applying Neosporin, and moisturizing cream.  The Neosporin helps.  Symptoms are also better when they drain on their own.  No aggravating factors.  Past medical history negative for acne, MRSA, diabetes, hidradenitis suppurativa.  He has a history of scalp psoriasis.  PCP: [place].  Dermatology: None.    Past Medical History:  Diagnosis Date   Medical history non-contributory    Morbid obesity with body mass index (BMI) of 45.0 to 49.9 in adult [facility])     Past Surgical History:  Procedure Laterality Date   FLEXIBLE SIGMOIDOSCOPY N/A 03/18/2023   Procedure: FLEXIBLE SIGMOIDOSCOPY WITH BIOPSY;  Surgeon: Unk Corinn Skiff, MD;  Location: [facility];  Service: Endoscopy;  Laterality: N/A;   POLYPECTOMY  03/18/2023   Procedure: POLYPECTOMY;  Surgeon: Unk Corinn Skiff, MD;  Location: [facility];  Service: Endoscopy;;  CLIP X 2 PLACED AT SIGMOID COLON POLYP REMOVAL SITE    Family History  Problem Relation Age of Onset   Healthy Mother    Colon cancer Father    Healthy Brother     Social History   Tobacco Use   Smoking status: Never   Smokeless tobacco: Never  Vaping Use   Vaping status: Never  Used  Substance Use Topics   Alcohol use: No   Drug use: Never    No current facility-administered medications for this encounter.  Current Outpatient Medications:    Clindamycin -Benzoyl Per, Refr, gel, Apply 1 Application topically daily., Disp: 45 g, Rfl: 0   bisacodyl (DULCOLAX) 5 MG EC tablet, Take 5 mg by mouth daily as needed., Disp: , Rfl:    Docusate Calcium (STOOL SOFTENER PO), Take by mouth., Disp: , Rfl:    polyethylene glycol powder (GLYCOLAX /MIRALAX ) 17 GM/SCOOP powder, Take 17 g by mouth as needed for mild constipation. Titrate to one soft BM per day, Disp: 3350 g, Rfl: 1  No Known Allergies   ROS  As noted in HPI.   Physical Exam  BP 122/67 (BP Location: Left Arm)   Pulse 86   Temp 98.1 F (36.7 C) (Tympanic)   Resp 18   SpO2 100%   Constitutional: Well developed, well nourished, no acute distress Eyes:  EOMI, conjunctiva normal bilaterally HENT: Normocephalic, atraumatic,mucus membranes moist Respiratory: Normal inspiratory effort Cardiovascular: Normal rate GI: nondistended skin: Multiple mildly tender erythematous cystic regions with pustules on face, scalp with no expressible purulent drainage                Multiple nontender nonerythematous masses left upper inner thigh.  Chaperone present during exam.  Patient states he also has several similar lesions in his axilla  Musculoskeletal: no  deformities Neurologic: Alert & oriented x 3, no focal neuro deficits Psychiatric: Speech and behavior appropriate   ED Course   Medications - No data to display  Orders Placed This Encounter  Procedures   Ambulatory referral to Dermatology    Referral Priority:   Urgent    Referral Type:   Consultation    Referral Reason:   Specialty Services Required    Requested Specialty:   Dermatology    Number of Visits Requested:   1    No results found for this or any previous visit (from the past 24 hours). No results found.  ED Clinical  Impression  1. Acne, unspecified acne type      ED Assessment/Plan     1.  Facial lesions/cysts.  I suspect cystic acne.  Will advise him to use gentle skin cleanser, avoid aggressive scrubbing of the skin, prescribe BenzaClin clindamycin  1.2%/benzyl peroxide 2.5% to apply once daily.  Will advise him that this may bleach hair and clothing.  Will place referral to dermatology at the [facility] clinic in [place].  2.  Lesions in the groin.  Suspect hidradenitis suppurativa.  Does not appear to be infected at this time.  Dermatology follow-up.  Discussed MDM, treatment plan, and plan for follow-up with patient.. patient agrees with plan.   Meds ordered this encounter  Medications   Clindamycin -Benzoyl Per, Refr, gel    Sig: Apply 1 Application topically daily.    Dispense:  45 g    Refill:  0    *This clinic note was created using Scientist, clinical (histocompatibility and immunogenetics). Therefore, there may be occasional mistakes despite careful proofreading.  ?    Van Knee, MD 05/03/24 1721

## 2024-05-03 NOTE — Discharge Instructions (Signed)
 gentle skin cleanser, avoid aggressive scrubbing of the skin, BenzaClin clindamycin  1.2%/benzyl peroxide 2.5% to apply once daily.  this may bleach hair and clothing.  I have put an urgent referral to the [facility] dermatology clinic in [place], if you have not heard from them in several days, give them a call.

## 2024-05-03 NOTE — ED Triage Notes (Signed)
 Pt states he has cyst all over his face x 1 month.

## 2024-05-13 ENCOUNTER — Ambulatory Visit
Admission: EM | Admit: 2024-05-13 | Discharge: 2024-05-13 | Disposition: A | Discharge status: Home / Self Care | Attending: Physician Assistant | Admitting: Physician Assistant

## 2024-05-13 DIAGNOSIS — L0501 Pilonidal cyst with abscess: Secondary | ICD-10-CM | POA: Diagnosis not present

## 2024-05-13 MED ORDER — AMOXICILLIN-POT CLAVULANATE 875-125 MG PO TABS: 1.0000 | ORAL_TABLET | Freq: Two times a day (BID) | ORAL | 0 refills | Status: AC

## 2024-05-13 MED ORDER — HYDROCODONE-ACETAMINOPHEN 5-325 MG PO TABS: 1.0000 | ORAL_TABLET | Freq: Four times a day (QID) | ORAL | 0 refills | Status: AC | PRN

## 2024-05-13 NOTE — Discharge Instructions (Addendum)
-  Area was drained and packed. Return in 2 days for re-check -Area will likely bleed and drain. Change bandage regularly -Begin antibiotics -I sent something for pain

## 2024-05-13 NOTE — ED Triage Notes (Signed)
 Pt states that he has a abscess on his tail bone. Pt states that there is not drainage from the abscess.  X2 days

## 2024-05-13 NOTE — ED Provider Notes (Signed)
 [facility]    CSN: [id number] Arrival date & time: 05/13/24  1640      History   Chief Complaint Chief Complaint  Patient presents with   Abscess    HPI Kelly Mccall is a 23 y.o. male presenting for 2-day history of swelling and pain around the tailbone.  He reports getting swelling and discomfort here and there for a long time but says it is more painful than normal and more swollen.  No bleeding or drainage.  Has been applying warm compresses without any relief.  Increased pain when sitting.  Has taken OTC meds for pain relief without improvement.  HPI  Past Medical History:  Diagnosis Date   Medical history non-contributory    Morbid obesity with body mass index (BMI) of 45.0 to 49.9 in adult [facility])     Patient Active Problem List   Diagnosis Date Noted   Rectal bleeding 03/18/2023   Polyp of sigmoid colon 03/18/2023   Internal hemorrhoid, bleeding 03/04/2023   Encounter to establish care 03/04/2023   History of Bell's palsy 03/04/2023   Testicular mass 03/04/2023   Chronic constipation 03/04/2023   Morbid obesity (HCC) 03/04/2023   Acanthosis nigricans 03/04/2023   Screening for hepatitis C declined 03/04/2023   HIV screening declined 03/04/2023    Past Surgical History:  Procedure Laterality Date   FLEXIBLE SIGMOIDOSCOPY N/A 03/18/2023   Procedure: FLEXIBLE SIGMOIDOSCOPY WITH BIOPSY;  Surgeon: Unk Corinn Skiff, MD;  Location: [facility];  Service: Endoscopy;  Laterality: N/A;   POLYPECTOMY  03/18/2023   Procedure: POLYPECTOMY;  Surgeon: Unk Corinn Skiff, MD;  Location: [facility];  Service: Endoscopy;;  CLIP X 2 PLACED AT SIGMOID COLON POLYP REMOVAL SITE       Home Medications    Prior to Admission medications   Medication Sig Start Date End Date Taking? Authorizing Provider  amoxicillin -clavulanate (AUGMENTIN ) 875-125 MG tablet Take 1 tablet by mouth every 12 (twelve) hours for 10 days. 05/13/24 05/23/24 Yes Arvis Huxley B, PA-C  bisacodyl (DULCOLAX) 5 MG EC tablet Take 5 mg by mouth daily as needed. 02/27/23  Yes [provider]  Clindamycin -Benzoyl Per, Refr, gel Apply 1 Application topically daily. 05/03/24  Yes Van Knee, MD  Docusate Calcium (STOOL SOFTENER PO) Take by mouth.   Yes [provider]  HYDROcodone -acetaminophen  (NORCO/VICODIN) 5-325 MG tablet Take 1 tablet by mouth every 6 (six) hours as needed for moderate pain (pain score 4-6) or severe pain (pain score 7-10). 05/13/24  Yes Arvis Huxley B, PA-C  polyethylene glycol powder (GLYCOLAX /MIRALAX ) 17 GM/SCOOP powder Take 17 g by mouth as needed for mild constipation. Titrate to one soft BM per day 03/04/23  Yes [name], FNP    Family History Family History  Problem Relation Age of Onset   Healthy Mother    Colon cancer Father    Healthy Brother     Social History Social History   Tobacco Use   Smoking status: Never   Smokeless tobacco: Never  Vaping Use   Vaping status: Never Used  Substance Use Topics   Alcohol use: No   Drug use: Never     Allergies   Patient has no known allergies.   Review of Systems Review of Systems  Constitutional:  Negative for fatigue and fever.  Skin:  Positive for color change. Negative for wound.       Swelling and pain of tailbone     Physical Exam Triage Vital Signs ED Triage  Vitals  Encounter Vitals Group     BP 05/13/24 1700 127/76     Girls Systolic BP Percentile --      Girls Diastolic BP Percentile --      Boys Systolic BP Percentile --      Boys Diastolic BP Percentile --      Pulse Rate 05/13/24 1700 78     Resp 05/13/24 1700 17     Temp 05/13/24 1700 98.2 F (36.8 C)     Temp Source 05/13/24 1700 Oral     SpO2 05/13/24 1700 97 %     Weight 05/13/24 1659 275 lb (124.7 kg)     Height 05/13/24 1659 5' 8 (1.727 m)     Head Circumference --      Peak Flow --      Pain Score 05/13/24 1659 7     Pain Loc --      Pain Education --       Exclude from Growth Chart --    No data found.  Updated Vital Signs BP 127/76 (BP Location: Left Arm)   Pulse 78   Temp 98.2 F (36.8 C) (Oral)   Resp 17   Ht 5' 8 (1.727 m)   Wt 275 lb (124.7 kg)   SpO2 97%   BMI 41.81 kg/m      Physical Exam Vitals and nursing note reviewed.  Constitutional:      General: He is not in acute distress.    Appearance: He is well-developed. He is obese. He is not ill-appearing.  HENT:     Head: Normocephalic and atraumatic.  Eyes:     General: No scleral icterus.    Conjunctiva/sclera: Conjunctivae normal.  Cardiovascular:     Rate and Rhythm: Normal rate.  Pulmonary:     Effort: Pulmonary effort is normal. No respiratory distress.  Musculoskeletal:     Cervical back: Neck supple.  Skin:    General: Skin is warm and dry.     Capillary Refill: Capillary refill takes less than 2 seconds.     Comments: 4 cm x 4 cm area of swelling, erythema and induration with mild fluctuance in center at gluteal cleft.  Consistent with pilonidal cyst with abscess.  Neurological:     General: No focal deficit present.     Mental Status: He is alert. Mental status is at baseline.     Motor: No weakness.     Gait: Gait normal.  Psychiatric:        Mood and Affect: Mood normal.        Behavior: Behavior normal.      UC Treatments / Results  Labs (all labs ordered are listed, but only abnormal results are displayed) Labs Reviewed - No data to display  EKG   Radiology No results found.  Procedures Incision and Drainage  Date/Time: 05/13/2024 6:15 PM  Performed by: Arvis Jolan NOVAK, PA-C Authorized by: Arvis Jolan NOVAK, PA-C   Consent:    Consent obtained:  Verbal   Consent given by:  Patient   Risks discussed:  Bleeding, infection, incomplete drainage and pain   Alternatives discussed:  Alternative treatment, delayed treatment and observation Universal protocol:    Patient identity confirmed:  Verbally with patient Location:    Type:   Pilonidal cyst   Size:  4 cm x 4 cm   Location:  Anogenital   Anogenital location:  Gluteal cleft Pre-procedure details:    Skin preparation:  Povidone-iodine Anesthesia:  Anesthesia method:  Local infiltration   Local anesthetic:  Lidocaine  1% w/o epi Procedure type:    Complexity:  Complex Procedure details:    Incision types:  Single straight   Drainage:  Bloody and purulent   Drainage amount:  Moderate   Wound treatment:  Wound left open   Packing materials:  1/4 in iodoform gauze Post-procedure details:    Procedure completion:  Tolerated well, no immediate complications  (including critical care time)  Medications Ordered in UC Medications - No data to display  Initial Impression / Assessment and Plan / UC Course  I have reviewed the triage vital signs and the nursing notes.  Pertinent labs & imaging results that were available during my care of the patient were reviewed by me and considered in my medical decision making (see chart for details).   23 year old male presents for pilonidal cyst with abscess for the past 2 days.  Warm soaks and Tylenol  taken without relief.  No fever.  No drainage from area.  Patient with consent for incision and drainage.  Patient tolerated well.  Moderate amount of purulent and bloody material relieved.  Packing placed in wound and wound left open.  Covered with bandage.  Advised to return in 2 days for wound recheck, packing removal and consideration of repacking.  Sent Augmentin  to pharmacy.  Also sent Norco as needed for severe pain.  Reviewed supportive care at home.  Reviewed returning if any acute worsening of pain sooner than 2 days.  Advised going to ER if any associated fever or acute worsening symptoms.   Final Clinical Impressions(s) / UC Diagnoses   Final diagnoses:  Pilonidal cyst with abscess     Discharge Instructions      -Area was drained and packed. Return in 2 days for re-check -Area will likely bleed and drain.  Change bandage regularly -Begin antibiotics -I sent something for pain    ED Prescriptions     Medication Sig Dispense Auth. Provider   amoxicillin -clavulanate (AUGMENTIN ) 875-125 MG tablet Take 1 tablet by mouth every 12 (twelve) hours for 10 days. 20 tablet Arvis Huxley B, PA-C   HYDROcodone -acetaminophen  (NORCO/VICODIN) 5-325 MG tablet Take 1 tablet by mouth every 6 (six) hours as needed for moderate pain (pain score 4-6) or severe pain (pain score 7-10). 8 tablet Arvis Huxley NOVAK, PA-C      I have reviewed the PDMP during this encounter.   Arvis Huxley NOVAK, PA-C 05/13/24 1818

## 2024-05-15 ENCOUNTER — Ambulatory Visit: Admission: EM | Admit: 2024-05-15 | Discharge: 2024-05-15 | Disposition: A | Discharge status: Home / Self Care

## 2024-05-15 DIAGNOSIS — Z5189 Encounter for other specified aftercare: Secondary | ICD-10-CM

## 2024-05-15 NOTE — ED Provider Notes (Signed)
 [facility]    CSN: [id number] Arrival date & time: 05/15/24  1251      History   Chief Complaint Chief Complaint  Patient presents with   Wound Check    HPI Kelly Mccall is a 23 y.o. male.   HPI  23 year old male with past medical history significant for acanthosis nigricans, morbid obesity, chronic constipation, and internal hemorrhoids presents for wound check status post I&D of a pilonidal cyst.  He has been changing his dressing at home and he reports that there is less drainage noted each day.  He is not having any pain at present.  He is currently taking his antibiotics.  He denies fever.  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/04/2023   Chronic constipation 03/04/2023   Morbid obesity (HCC) 03/04/2023   Acanthosis nigricans 03/04/2023   Screening for hepatitis C declined 03/04/2023   HIV screening declined 03/04/2023    Past Surgical History:  Procedure Laterality Date   FLEXIBLE SIGMOIDOSCOPY N/A 03/18/2023   Procedure: FLEXIBLE SIGMOIDOSCOPY WITH BIOPSY;  Surgeon: Unk Corinn Skiff, MD;  Location: [facility];  Service: Endoscopy;  Laterality: N/A;   POLYPECTOMY  03/18/2023   Procedure: POLYPECTOMY;  Surgeon: Unk Corinn Skiff, MD;  Location: [facility];  Service: Endoscopy;;  CLIP X 2 PLACED AT SIGMOID COLON POLYP REMOVAL SITE       Home Medications    Prior to Admission medications   Medication Sig Start Date End Date Taking? Authorizing Provider  amoxicillin -clavulanate (AUGMENTIN ) 875-125 MG tablet Take 1 tablet by mouth every 12 (twelve)  hours for 10 days. 05/13/24 05/23/24 Yes Arvis Huxley B, PA-C  Clindamycin -Benzoyl Per, Refr, gel Apply 1 Application topically daily. 05/03/24  Yes Van Knee, MD  bisacodyl (DULCOLAX) 5 MG EC tablet Take 5 mg by mouth daily as needed. Patient not taking: Reported on 05/15/2024 02/27/23   [provider]  Docusate Calcium (STOOL SOFTENER PO) Take by mouth. Patient not taking: Reported on 05/15/2024    [provider]  HYDROcodone -acetaminophen  (NORCO/VICODIN) 5-325 MG tablet Take 1 tablet by mouth every 6 (six) hours as needed for moderate pain (pain score 4-6) or severe pain (pain score 7-10). 05/13/24   Arvis Huxley B, PA-C  polyethylene glycol powder (GLYCOLAX /MIRALAX ) 17 GM/SCOOP powder Take 17 g by mouth as needed for mild constipation. Titrate to one soft BM per day Patient not taking: Reported on 05/15/2024 03/04/23   [name], FNP    Family History Family History  Problem Relation Age of Onset   Healthy Mother    Colon cancer Father    Healthy Brother     Social History Social History   Tobacco Use   Smoking status: Never   Smokeless tobacco: Never  Vaping Use   Vaping status: Never Used  Substance Use Topics   Alcohol use: No   Drug use: Never     Allergies   Patient has no known allergies.   Review of Systems Review of Systems  Constitutional:  Negative for fever.  Skin:  Positive for wound.  Physical Exam Triage Vital Signs ED Triage Vitals  Encounter Vitals Group     BP      Girls Systolic BP Percentile      Girls Diastolic BP Percentile      Boys Systolic BP Percentile      Boys Diastolic BP Percentile      Pulse      Resp      Temp      Temp src      SpO2      Weight      Height      Head Circumference      Peak Flow      Pain Score      Pain Loc      Pain Education      Exclude from Growth Chart    No data found.  Updated Vital Signs BP 109/68 (BP Location: Left Arm)   Pulse 74   Temp 98.8 F (37.1 C)  (Oral)   SpO2 99%   Visual Acuity Right Eye Distance:   Left Eye Distance:   Bilateral Distance:    Right Eye Near:   Left Eye Near:    Bilateral Near:     Physical Exam Vitals and nursing note reviewed.  Constitutional:      Appearance: Normal appearance. He is not ill-appearing.  Skin:    General: Skin is warm and dry.     Capillary Refill: Capillary refill takes less than 2 seconds.     Findings: Erythema present.  Neurological:     General: No focal deficit present.     Mental Status: He is alert and oriented to person, place, and time.      UC Treatments / Results  Labs (all labs ordered are listed, but only abnormal results are displayed) Labs Reviewed - No data to display  EKG   Radiology No results found.  Procedures Procedures (including critical care time)  Medications Ordered in UC Medications - No data to display  Initial Impression / Assessment and Plan / UC Course  I have reviewed the triage vital signs and the nursing notes.  Pertinent labs & imaging results that were available during my care of the patient were reviewed by me and considered in my medical decision making (see chart for details).   Patient is a pleasant, nontoxic-appearing 23 year old male presenting for wound check status post I&D pilonidal cyst as outlined in HPI above.  As you can see in image above, there is a packing strip in place.  The surrounding tissue is free of erythema, edema, induration, or fluctuance.  I did remove the packing strip in its entirety.  There was minimal drainage on the packing strip and no drainage from the wound following packing removal.  I do not feel the need to repack the wound.  I did dress the wound with 4 x 4 gauze and Mefix tape.  I have advised the patient to continue taking his antibiotics.  If he develops any increased redness, swelling, pain, or fever he can return for reevaluation.  I did caution him that pilonidal cysts are likely to come  back.  If it comes back he will probably need to see general surgery for operative resolution.   Final Clinical Impressions(s) / UC Diagnoses   Final diagnoses:  Wound check, abscess     Discharge Instructions      Your abscess seems to be healing well.  Keep dressing in place until all of the drainage has  resolved.  Finish your antibiotic as previously prescribed.  If you develop any increased pain, redness, increase in drainage, or fever please return for reevaluation.  As we discussed, pilonidal cyst are likely to recur.  If you have recurrence of symptoms you will need to see general surgery for an operative resolution to your recurring pilonidal cyst.     ED Prescriptions   None    PDMP not reviewed this encounter.   Bernardino Ditch, NP 05/15/24 1335

## 2024-05-15 NOTE — Discharge Instructions (Signed)
 Your abscess seems to be healing well.  Keep dressing in place until all of the drainage has resolved.  Finish your antibiotic as previously prescribed.  If you develop any increased pain, redness, increase in drainage, or fever please return for reevaluation.  As we discussed, pilonidal cyst are likely to recur.  If you have recurrence of symptoms you will need to see general surgery for an operative resolution to your recurring pilonidal cyst.

## 2024-05-15 NOTE — ED Triage Notes (Signed)
 Patient two days s/p I&D pilonidal abscess.  Patient returns today for wound check.  Patient denies pain.  Has completed at home dressing changes x 2.  Reports less drainage noted with second dressing change.  Pain rating now 0/10.  Patient has been taking his oral abx as directed.  Norco PRN

## 2024-06-24 ENCOUNTER — Ambulatory Visit: Admitting: Family Medicine

## 2024-06-24 VITALS — BP 104/76 | HR 80 | Ht 68.0 in | Wt 278.0 lb

## 2024-06-24 DIAGNOSIS — R1909 Other intra-abdominal and pelvic swelling, mass and lump: Secondary | ICD-10-CM | POA: Diagnosis not present

## 2024-06-24 DIAGNOSIS — L700 Acne vulgaris: Secondary | ICD-10-CM | POA: Insufficient documentation

## 2024-06-24 MED ORDER — CLINDAMYCIN PHOS-BENZOYL PEROX 1.2-5 % EX GEL: 1.0000 | Freq: Every day | CUTANEOUS | 0 refills | Status: AC

## 2024-06-24 NOTE — Assessment & Plan Note (Signed)
 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 No significant spread or skin breakdown, appears improved relative to pictures in the chart from prior. -Clindamycin -benzoyl peroxide gel daily until resolution of acne, then PRN for recurrence

## 2024-06-24 NOTE — Progress Notes (Signed)
    SUBJECTIVE:   CHIEF COMPLAINT / HPI:   Patient visited urgent care in July twice, once for cystic acne (treated effectively with clinda-benzoyl cream until it ran out), second time for drainage of a pilonidal cyst which has now resolved. He presents today primarily for refill of his acne cream and to check if the nodules/cysts in his groin/armpits are concerning.   Doesn't use any medicines every day.   PERTINENT  PMH / PSH: Internal hemorrhoids, sigmoid polyp, obesity  OBJECTIVE:   BP 104/76   Pulse 80   Ht 5' 8 (1.727 m)   Wt 278 lb (126.1 kg)   SpO2 98%   BMI 42.27 kg/m   General: Awake, alert, NAD. Communicates clearly. Skin: Various raised, erythematous nodular eruptions noted around the perimeter of the hairline and under the chin underneath his beard. 2 firm nodules appreciated underneath the skin of the upper thighs, one on the upper R medial thigh and the other just lateral to the scrotum. Axillary region notable primarily for hyperpigmentation that appears most consistent w/ acanthosis nigricans. Equivocal nodule palpated in L axilla, no erythema or superficial skin change appreciated.   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 No significant spread or skin breakdown, appears improved relative to pictures in the chart from prior. -Clindamycin -benzoyl peroxide gel daily until resolution of acne, then PRN for recurrence Nodule of groin Low concern for HS at this time. Patient has hx of drainage of one pilonidal cyst without other cysts/abscesses requiring procedures in the past. Subcutaneous nodules appreciated w/o erythema or pain, no sign of tunneling or fistulas. No drainage.  -Recommended epsom salt baths and f/u with PCP to discuss big picture management of weight loss.  Patient verbalized understanding and agrees w/ plan.      Leafy Scriver, DO [facility]
